# Patient Record
Sex: Female | Born: 1980 | Race: Black or African American | Hispanic: No | Marital: Married
Health system: Southern US, Community
[De-identification: ages and names within clinical notes are randomized; demographics above are authoritative.]

---

## 2015-12-18 ENCOUNTER — Emergency Department (HOSPITAL_COMMUNITY): Payer: Self-pay

## 2015-12-18 ENCOUNTER — Encounter (HOSPITAL_COMMUNITY): Payer: Self-pay | Admitting: Emergency Medicine

## 2015-12-18 ENCOUNTER — Emergency Department (HOSPITAL_COMMUNITY)
Admission: EM | Admit: 2015-12-18 | Discharge: 2015-12-18 | Disposition: A | Discharge status: Home / Self Care | Payer: Self-pay | Attending: Emergency Medicine | Admitting: Emergency Medicine

## 2015-12-18 DIAGNOSIS — Y9389 Activity, other specified: Secondary | ICD-10-CM | POA: Insufficient documentation

## 2015-12-18 DIAGNOSIS — Y929 Unspecified place or not applicable: Secondary | ICD-10-CM | POA: Insufficient documentation

## 2015-12-18 DIAGNOSIS — S82301A Unspecified fracture of lower end of right tibia, initial encounter for closed fracture: Secondary | ICD-10-CM | POA: Insufficient documentation

## 2015-12-18 DIAGNOSIS — Y999 Unspecified external cause status: Secondary | ICD-10-CM | POA: Insufficient documentation

## 2015-12-18 DIAGNOSIS — X501XXA Overexertion from prolonged static or awkward postures, initial encounter: Secondary | ICD-10-CM | POA: Insufficient documentation

## 2015-12-18 DIAGNOSIS — S82891A Other fracture of right lower leg, initial encounter for closed fracture: Secondary | ICD-10-CM

## 2015-12-18 MED ORDER — HYDROCODONE-ACETAMINOPHEN 5-325 MG PO TABS: 1.0000 | ORAL_TABLET | ORAL | 0 refills | Status: AC | PRN

## 2015-12-18 MED ORDER — METHOCARBAMOL 500 MG PO TABS: 500.0000 mg | ORAL_TABLET | Freq: Two times a day (BID) | ORAL | 0 refills | Status: AC | PRN

## 2015-12-18 MED ORDER — METHOCARBAMOL 500 MG PO TABS
1000.0000 mg | ORAL_TABLET | ORAL | Status: AC
  Administered 2015-12-18: 500 mg via ORAL
  Filled 2015-12-18: qty 2

## 2015-12-18 NOTE — Discharge Instructions (Signed)
Read the information below.  Use the prescribed medication as directed.  Please discuss all new medications with your pharmacist.  Do not take additional tylenol while taking the prescribed pain medication to avoid overdose.  You may return to the Emergency Department at any time for worsening condition or any new symptoms that concern you.    If you develop uncontrolled pain, weakness or numbness of the extremity, severe discoloration of the skin, or you are unable to move your toes, return to the ER for a recheck.    °

## 2015-12-18 NOTE — ED Provider Notes (Signed)
[facility]-EMERGENCY DEPT Provider Note   CSN: [id number] Arrival date & time: 12/18/15  1251   By signing my name below, I, Clovis PuAvnee Patel, attest that this documentation has been prepared under the direction and in the presence of  [facility] HospitalEmily Suellen Durocher, PA-C. Electronically Signed: Clovis PuAvnee Patel, ED Scribe. 12/18/15. 2:12 PM.   History   Chief Complaint Chief Complaint  Patient presents with  . Ankle Injury    The history is provided by the patient. No language interpreter was used.    Colleen CollinJessica Canelo is a 35 y.o. female who presents to the Emergency Department complaining of constant, gradually worsening medial right ankle pain onset 2 days ago s/p injury. Pt states she was coming down the stairs, pointed her toes, stepped on her right foot oddly and felt a sharp pain. She denies falls, head injury or additional injuries. She has taken ibuprofen, aleve and applied heat and ice to the area with no relief. Pt states the pain worsens when she tries to move her ankle as well as to the touch. Pt denies chills, back pain, fevers, numbness, leg swelling, calf pain, body aches.  History reviewed. No pertinent past medical history.  There are no active problems to display for this patient.   History reviewed. No pertinent surgical history.  OB History    Gravida Para Term Preterm AB Living   1             SAB TAB Ectopic Multiple Live Births                   Home Medications    Prior to Admission medications   Medication Sig Start Date End Date Taking? Authorizing Provider  HYDROcodone-acetaminophen (NORCO/VICODIN) 5-325 MG tablet Take 1 tablet by mouth every 4 (four) hours as needed for moderate pain or severe pain. 12/18/15   Trixie DredgeEmily Ellasyn Swilling, PA-C  methocarbamol (ROBAXIN) 500 MG tablet Take 1 tablet (500 mg total) by mouth 2 (two) times daily as needed (pain). 12/18/15   Trixie DredgeEmily Yazaira Speas, PA-C    Family History No family history on file.  Social History Social History  Substance Use Topics  . Smoking  status: Not on file  . Smokeless tobacco: Not on file  . Alcohol use Not on file     Allergies   Naproxen and Tramadol   Review of Systems Review of Systems  Constitutional: Negative for chills and fever.  Cardiovascular: Negative for leg swelling.  Musculoskeletal: Positive for arthralgias. Negative for back pain and myalgias.  Skin: Negative for color change and wound.  Neurological: Negative for weakness and numbness.  Hematological: Does not bruise/bleed easily.  Psychiatric/Behavioral: Negative for self-injury.     Physical Exam Updated Vital Signs BP 105/66 (BP Location: Left Arm)   Pulse 72   Temp 99.3 F (37.4 C) (Oral)   Resp 18   Ht 5\' 6"  (1.676 m)   LMP 12/17/2015   SpO2 96%   Physical Exam  Constitutional: She appears well-developed and well-nourished.  HENT:  Head: Normocephalic and atraumatic.  Neck: Neck supple.  Pulmonary/Chest: Effort normal.  Musculoskeletal: She exhibits edema and tenderness.  Right ankle with edema and tenderness over lateral and medial malleoli. Distal pulses and senses intact. She is able to move all toes. No tenderness to the foot or lower leg .Calf is soft and non tender. Compartments are soft No erythema warmth . No skin changes.  Neurological: She is alert.  Nursing note and vitals reviewed.    ED Treatments /  Results  DIAGNOSTIC STUDIES:  Oxygen Saturation is 96% on room air, normal by my interpretation.    COORDINATION OF CARE:  1:56 PM Will order XR. Discussed treatment plan with pt at bedside and pt agreed to plan.  Labs (all labs ordered are listed, but only abnormal results are displayed) Labs Reviewed - No data to display  EKG  EKG Interpretation None       Radiology Dg Ankle Complete Right  Result Date: 12/18/2015 CLINICAL DATA:  Larey Seat down steps 2 days ago.  Persistent pain. EXAM: RIGHT ANKLE - COMPLETE 3+ VIEW COMPARISON:  None. FINDINGS: There is a nondisplaced intra-articular fracture of the  distal radius. This has the appearance of a subacute healing fracture. The ankle mortise is maintained. No fibular fractures identified. Possible tiny avulsion fracture off the distal tip of the lateral malleolus. The mid and hindfoot bony structures are intact. IMPRESSION: Intra-articular fracture of the distal tibia. This has the appearance of a subacute fracture. CT may be helpful for further evaluation. Electronically Signed   By: Rudie Meyer M.D.   On: 12/18/2015 14:41    Procedures Procedures (including critical care time)  Medications Ordered in ED Medications  methocarbamol (ROBAXIN) tablet 1,000 mg (500 mg Oral Given 12/18/15 1456)     Initial Impression / Assessment and Plan / ED Course  I have reviewed the triage vital signs and the nursing notes.  Pertinent labs & imaging results that were available during my care of the patient were reviewed by me and considered in my medical decision making (see chart for details).  Clinical Course    Afebrile, nontoxic patient with injury to her right ankle while stepping oddly on the stairs two days ago.   Xray demonstrates distal tibia fracture that appears subacute.  Neurovascularly intact.  The injury is not open.   Pt denies any prior injury.  Pt states she can't take naproxen because it hurts her stomach and has had hives as reaction to tramadol.   D/C home with splint, crutches, pain medication, orthopedic follow up.  Discussed result, findings, treatment, and follow up  with patient.  Pt given return precautions.  Pt verbalizes understanding and agrees with plan.      Final Clinical Impressions(s) / ED Diagnoses   Final diagnoses:  Closed right ankle fracture, initial encounter    New Prescriptions Discharge Medication List as of 12/18/2015  3:21 PM    START taking these medications   Details  HYDROcodone-acetaminophen (NORCO/VICODIN) 5-325 MG tablet Take 1 tablet by mouth every 4 (four) hours as needed for moderate pain or  severe pain., Starting Sun 12/18/2015, Print    methocarbamol (ROBAXIN) 500 MG tablet Take 1 tablet (500 mg total) by mouth 2 (two) times daily as needed (pain)., Starting Sun 12/18/2015, Print        I personally performed the services described in this documentation, which was scribed in my presence. The recorded information has been reviewed and is accurate.     Trixie Dredge, PA-C 12/18/15 1539    Loren Racer, MD 12/18/15 [phone]

## 2015-12-18 NOTE — ED Triage Notes (Signed)
Pt c/o right ankle pain. States she was walking down the steps and twisted her ankle. Ankle appears swollen Range of motion in all toes, strong pedal pulses.

## 2017-08-03 IMAGING — DX DG ANKLE COMPLETE 3+V*R*
3 series · 3 of 3 positions shown · non-contrast
Comparison: None.

CLINICAL DATA: Fell down steps 2 days ago.  Persistent pain.

EXAM:
RIGHT ANKLE - COMPLETE 3+ VIEW

[ankle ap]
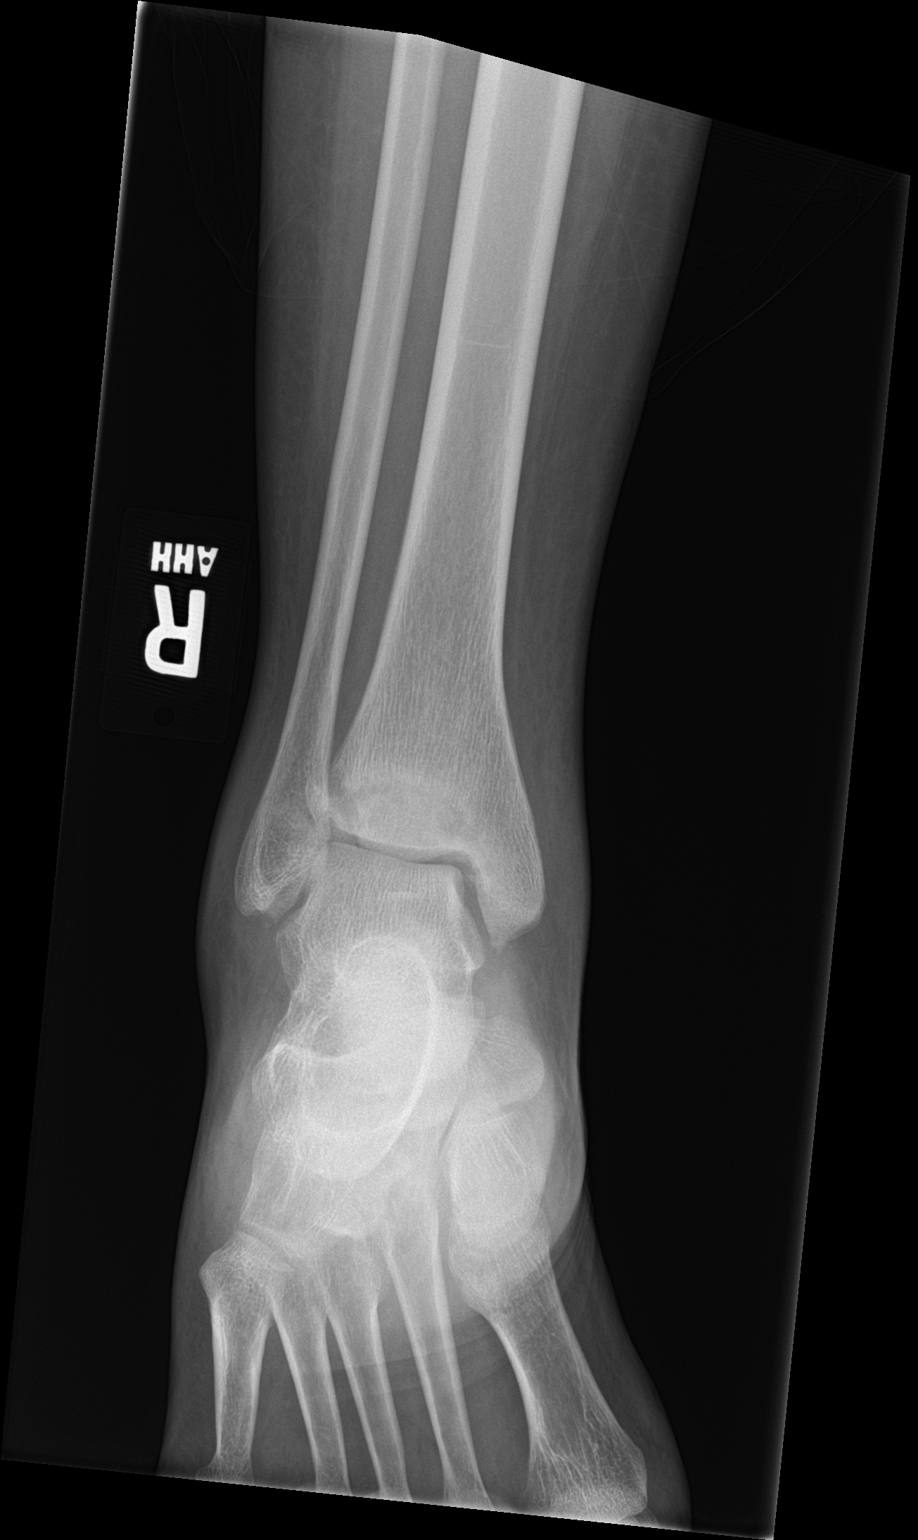

[ankle obl]
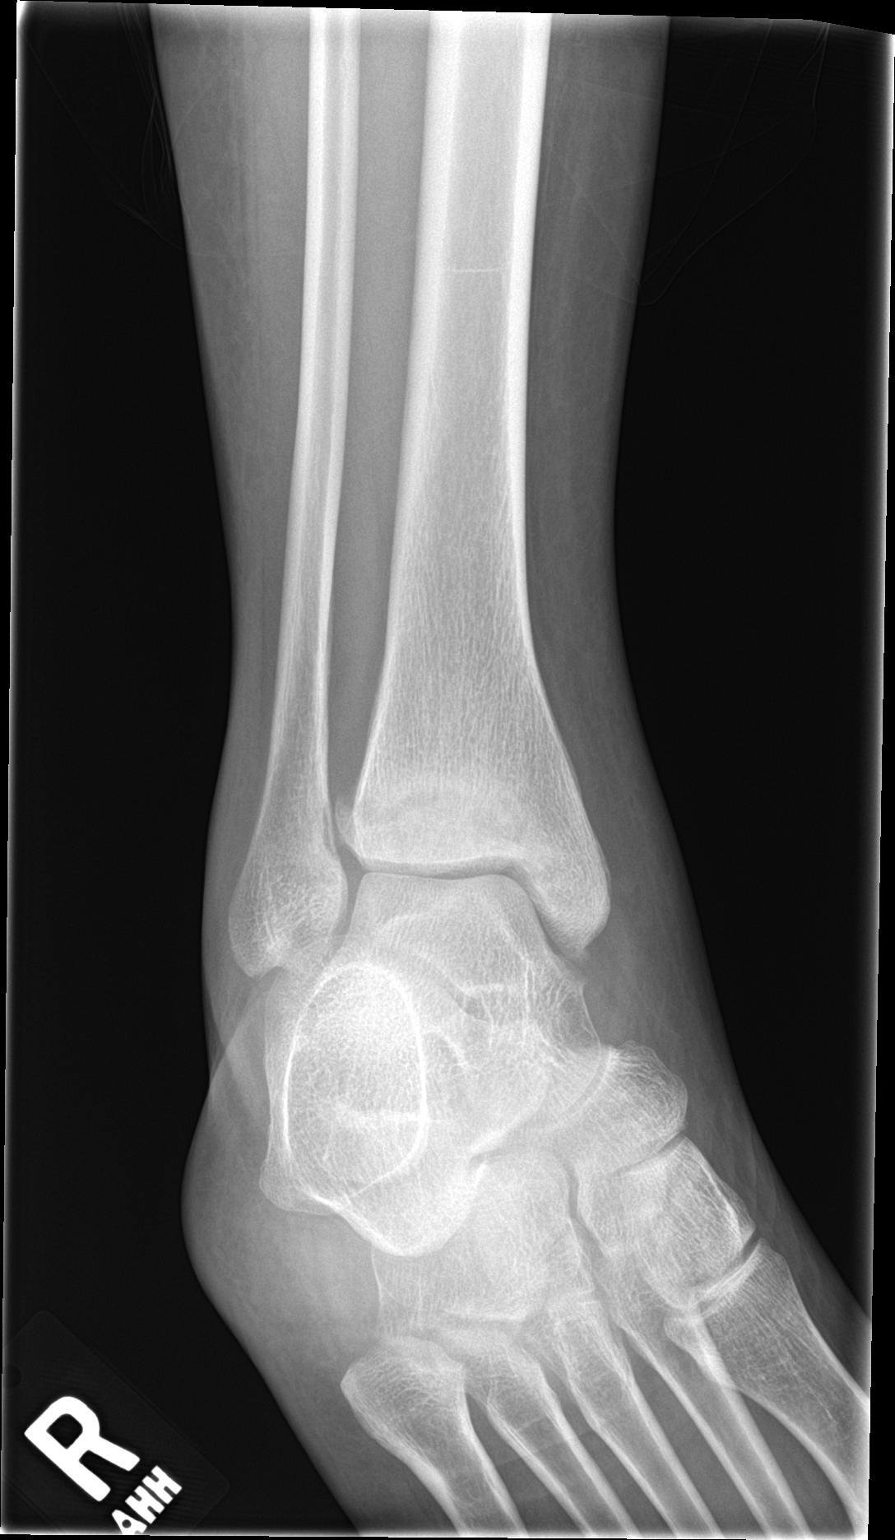

[ankle lat]
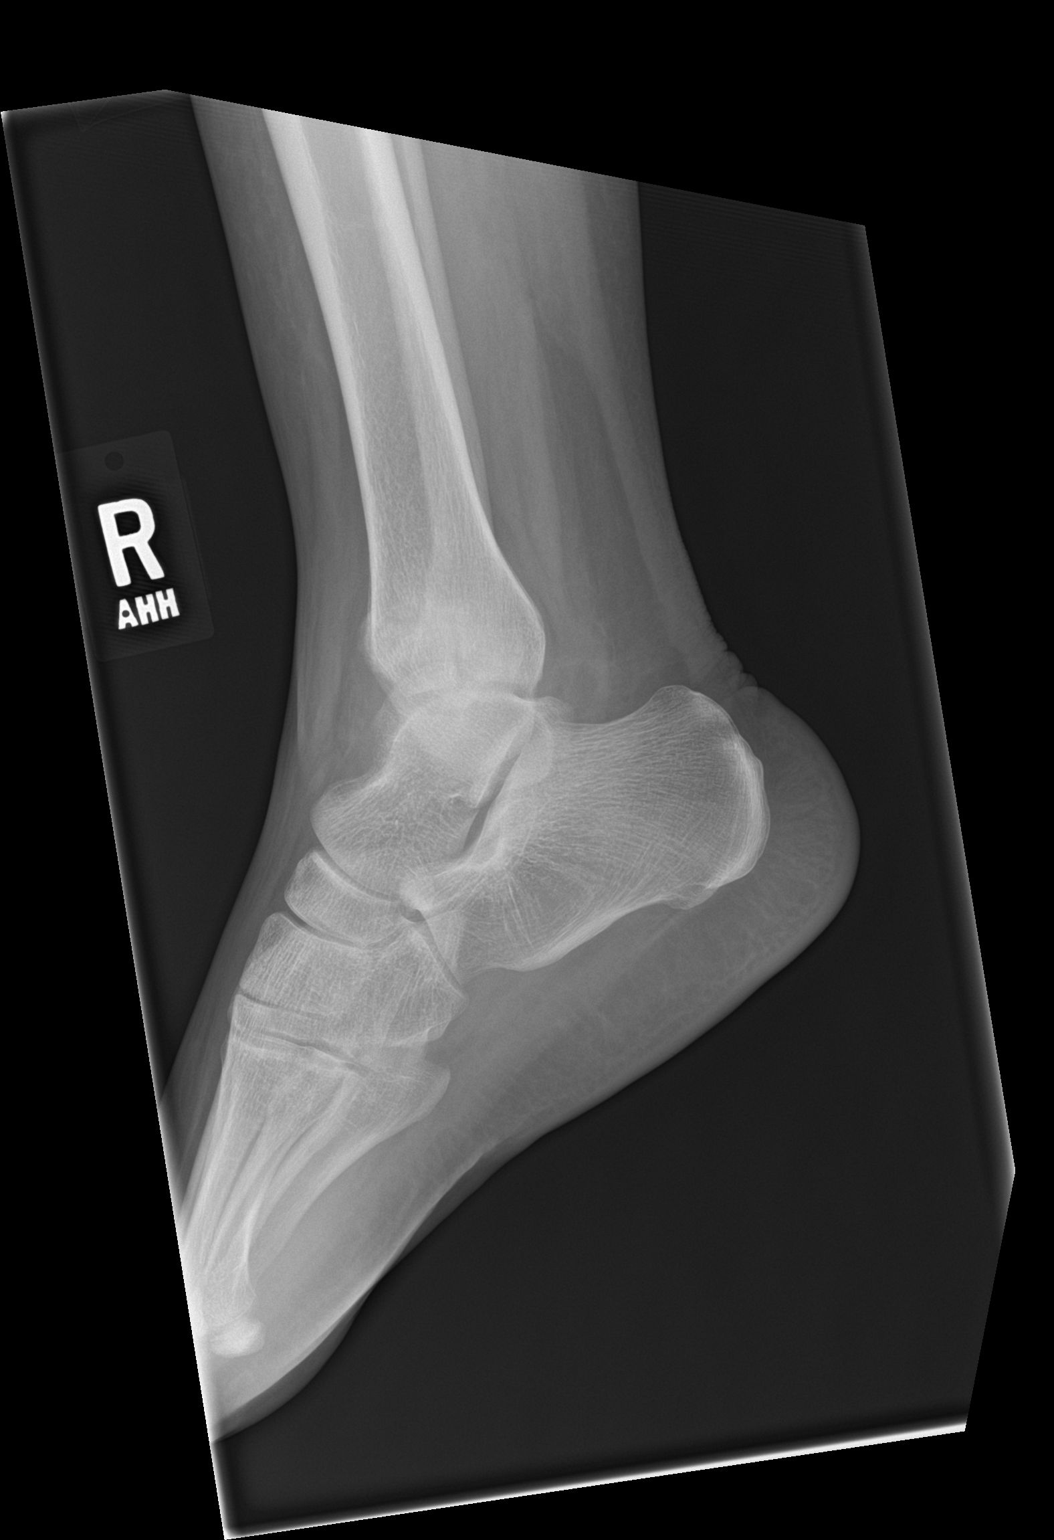

[3 of 3 positions shown; findings below may reference images not displayed]

FINDINGS: There is a nondisplaced intra-articular fracture of the distal
radius. This has the appearance of a subacute healing fracture. The
ankle mortise is maintained. No fibular fractures identified.
Possible tiny avulsion fracture off the distal tip of the lateral
malleolus. The mid and hindfoot bony structures are intact.
IMPRESSION: Intra-articular fracture of the distal tibia. This has the
appearance of a subacute fracture. CT may be helpful for further
evaluation.
